# Patient Record
Sex: Female | Born: 1945 | Race: Black or African American | Hispanic: No
Health system: Southern US, Community
[De-identification: ages and names within clinical notes are randomized; demographics above are authoritative.]

---

## 2007-04-08 ENCOUNTER — Inpatient Hospital Stay: Payer: Self-pay | Admitting: Internal Medicine

## 2007-04-08 ENCOUNTER — Other Ambulatory Visit: Payer: Self-pay

## 2007-10-27 ENCOUNTER — Other Ambulatory Visit: Payer: Self-pay

## 2007-10-27 ENCOUNTER — Emergency Department: Payer: Self-pay | Admitting: Emergency Medicine

## 2007-11-02 ENCOUNTER — Ambulatory Visit: Payer: Self-pay | Admitting: Family Medicine

## 2012-05-26 ENCOUNTER — Ambulatory Visit: Payer: Self-pay | Admitting: Family Medicine

## 2013-06-08 ENCOUNTER — Ambulatory Visit: Payer: Self-pay | Admitting: Family Medicine

## 2013-07-19 ENCOUNTER — Ambulatory Visit: Payer: Self-pay | Admitting: Specialist

## 2013-08-01 ENCOUNTER — Ambulatory Visit: Payer: Self-pay | Admitting: Specialist

## 2014-12-30 ENCOUNTER — Emergency Department: Payer: Self-pay | Admitting: Student

## 2015-03-07 ENCOUNTER — Other Ambulatory Visit: Payer: Self-pay | Admitting: Neurology

## 2015-03-11 NOTE — Consult Note (Signed)
PATIENT NAME:  Suzanne Thompson, Suzanne Thompson DATE OF BIRTH:  [birth year]  DATE OF CONSULTATION:  12/31/2014  CONSULTING PHYSICIAN:  Sharlet Notaro K. Sahana Boyland, MD  SUBJECTIVE: The patient was seen in the [facility] Emergency Room, 21A, with her daughter who is a good historian. The patient is a 69 years old and is retired. Has been on disability for mental illness, that is schizoaffective disorder, bipolar, depressed for many years. The patient is divorced and lives by herself in a 1 bedroom apartment. The patient is being followed by Dr. at [facility]. The last appointment was just a few weeks ago. The next appointment is coming up on Wednesday, 01/03/2015. The patient is on IM medication and she is stabilized. The patient was brought by her daughter because she has bizarre and erratic behavior. She was speaking to individuals that were not there and this was a big concern and they thought that she was depressed. In addition, they thought that she was probably hallucinating. She has had worsening depression for the past 1 month or so and concerned about responding to internal stimuli. Is muttering and speaking at invisible objects. The daughter called [facility] and they recommended that she be brought here for help to be checked out.   PAST PSYCHIATRIC HISTORY: History of inpatient hospital psychiatry on several occasions many years ago and then finally she was stabilized. Recently, she has not had any inpatient hospitalizations and is being followed on an outpatient basis at [facility]. Is doing fine on IM shot which she gets once a month.   MENTAL STATUS: The patient was seen lying in bed. She is alert and she knew her name. She did not know the day, date or time and the daughter reports that she never keeps up with the same. Affect is flat. Mood appears to be low and down. Does not appear to be actively responding to internal stimuli. Calm, pleasant and cooperative. No agitation. Denies  any active suicidal or homicidal plans. Insight and judgment guarded. Impulse control is fair.   IMPRESSION: Schizoaffective disorder, bipolar, current episode depressed.   RECOMMEND: Continue current medication. The patient is to be re-evaluated in the morning of 01/01/2015 to see how she is responding and doing. The patient has an appointment coming up with her outpatient doctor at [facility] on 01/03/2015. She should be considered for ACT Team so that she can get close follow up and evaluation and help as needed and social services is to look into that with the help of the physician.        ____________________________ Jannet MantisSurya K. Guss Bundehalla, MD skc:TT D: 12/31/2014 14:22:03 ET T: 12/31/2014 15:03:55 ET JOB#: [id number]  cc: Monika SalkSurya K. Guss Bundehalla, MD, <Dictator> Beau FannySURYA K Tawan Degroote MD ELECTRONICALLY SIGNED 01/06/2015 16:22

## 2015-03-11 NOTE — Consult Note (Signed)
PATIENT NAME:  Suzanne Thompson, Suzanne Thompson MR#:  [id number] DATE OF BIRTH:  Aug 24, 1946  DATE OF CONSULTATION:  01/01/2015  CONSULTING PHYSICIAN:  Audery Amel, MD  HISTORY OF PRESENT ILLNESS: Follow-up note for this 69 year old woman who was brought to the Emergency Room by her family because of their concern that she has been getting more depressed and psychotic.    CHIEF COMPLAINT: "My family was concerned."   HISTORY OF PRESENT ILLNESS: Information from the chart and the patient. The patient was apparently not talking or talking very little when she first came to the Emergency Room, but then opened up and spoke a little bit to Dr. Guss Bunde. Today, she conversed with me although again she did not give a lot of detail. She said that she had stopped taking her medicine some time ago. Her reason for stopping it is that she thought that maybe it was having side effects although she cannot tell me exactly which medicine she even stopped. She says that she had been having some thoughts recently that were problems. She will not give me any details about what those are. She denies suicidal or homicidal ideation. She denies any hallucinations, although she seems to be a little evasive on that point. No evidence of any substance abuse.   I spoke with her daughter, Ava, today. Daughter is also coming to visit the patient this afternoon. Daughter repeats to me that the patient had seemed to be going downhill for at least several weeks getting more withdrawn, talking bizarre stuff, having delusions and the family was getting more concerned about her.   PAST HISTORY: History of schizoaffective disorder and had multiple hospitalizations in the past. The patient recalls those hospitalizations as also involving bad thoughts. She denies ever having tried to kill herself or ever being violent. Substance abuse history: No evidence here of any history of substance abuse.   PAST MEDICAL HISTORY: Overweight woman, honestly, looks  probably at least her stated age or older. She is on medicine for blood pressure, hypothyroidism, and diabetes.   REVIEW OF SYSTEMS: The patient tends to deny anything specific right now. Denies suicidal ideation. Denies hallucinations. Does not complain of any specific physical symptoms.   MENTAL STATUS EXAMINATION: Disheveled woman. Passively cooperative. Eye contact intermittent. Psychomotor activity slow and sluggish. Speech is minimal in amount and quiet with some whispering almost at times. Affect has a somewhat odd smile about it. Not very reactive. Mood is stated as being better. Thoughts are showing some thought blocking and simplicity of thinking. Denies hallucinations. Denies suicidal or homicidal ideation. The patient was not given further formal cognitive testing. She is oriented to being in the hospital and her current situation. I see from Dr. Ranelle Oyster note that the patient says she never keeps up with the date.   LABORATORY RESULTS: Urinalysis shows more red cells than white cells, no bacteria, probably not infected. Head CT shows no diagnostic findings.   ASSESSMENT: A 69 year old woman with schizoaffective disorder. She has been off her medicine probably for over a month. Exactly what she had stopped taking and what she was still taking is unclear. She was brought into the hospital almost unresponsive with psychosis. Today she is more responsive but still not thinking clearly, very slow, flat, and sluggish, with minimal elaboration to her speech. Given that she lives independently it seems to me that she probably needs more time to stabilize. So far she has been given her oral medicine including her thyroid medication and her antidepressant. I  do not think we have given her an antipsychotic injection yet.   TREATMENT PLAN: The case was discussed with the Emergency Room psychiatry staff. I recommend that we refer her to a geriatric psychiatry unit for admission and stabilization. I plan to  speak to the daughter again after she visits with the patient. If daughter feels strongly that the patient is safe to come back home we can reconsider the plan but at this point, I think hospitalization would probably be wisest. Continue current medicines for now.   DIAGNOSIS, PRINCIPAL AND PRIMARY: AXIS I: Schizoaffective disorder, depressed.   SECONDARY DIAGNOSES:  1.  AXIS I: No further.  2.  AXIS II: No diagnosis.  3.  AXIS III: Diabetes, high blood pressure, hypothyroidism, and overweight.    ___________________________ Audery AmelJohn T. Clapacs, MD jtc:mc D: 01/01/2015 13:06:31 ET T: 01/01/2015 13:23:13 ET JOB#: [id number]  cc: Audery AmelJohn T. Clapacs, MD, <Dictator> Audery AmelJOHN T CLAPACS MD ELECTRONICALLY SIGNED 01/16/2015 10:35

## 2015-03-11 NOTE — Consult Note (Signed)
P patient will be transferred to a geriatric psychiatry facility as soon as one is available.  I think one may be opening up today in which case the transfer will be done asremains confused.  Patient makes good eye contact and smiles today but responds very little.  Seems to be still confused and not thinking clearly.  She is eating.  Taking medicine.  No new physical complaints. disorganized affect flat.  patient will be transferred to a geriatric psychiatry facility as soon as one is available.  I think one may be opening up today in which case the transfer will be done as soon as possible for further care.  Patient is aware of a plan.  No further change at this time.  Electronic Signatures: Selia Wareing, Jackquline DenmarkJohn T (MD)  (Signed on 23-Feb-16 18:55)  Authored  Last Updated: 23-Feb-16 18:55 by Audery Amellapacs, Mareo Portilla T (MD)

## 2016-01-09 DEATH — deceased

## 2016-10-14 IMAGING — CT CT HEAD WITHOUT CONTRAST
2 of 3 series · 16 of 30 positions shown, 18 images · non-contrast
Comparison: None.

CLINICAL DATA: Per daughter, pt normally lives alone and cares for
self, she called mom and there was no answer, went to check on her
and pt withdrawn and less verbal, some paranoid conversation, pt in
triage is hesitant to answer and when she does answer is clear but
rambling ideas, denies pain. Pt now unable to answer any med hx
questions. Pt with documented history of bipolar disorder and
schizophrenia.

EXAM:
CT HEAD WITHOUT CONTRAST
TECHNIQUE: Contiguous axial images were obtained from the base of the skull
through the vertex without intravenous contrast.

[Series 2: soft tissue · axial · 0.44mm/px · z∈[-268,-148]mm · 8 of 32 slices shown, 10 images]
[im 4/32  brain]
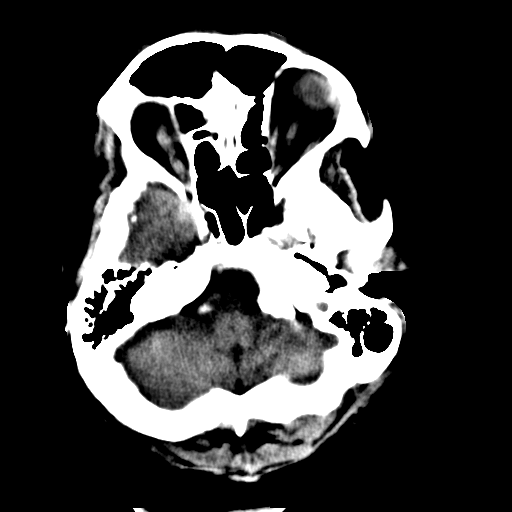
[im 4/32  bone]
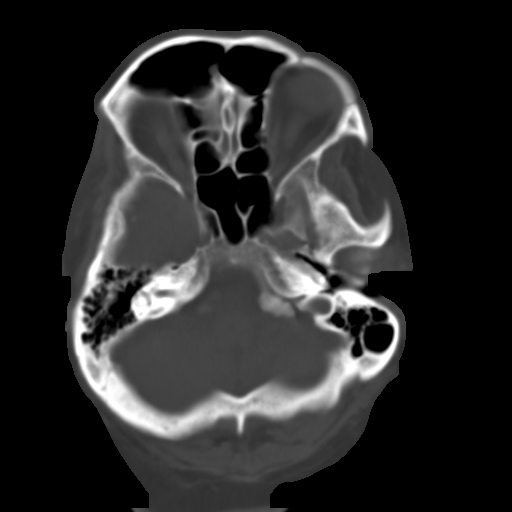
[im 7/32  brain]
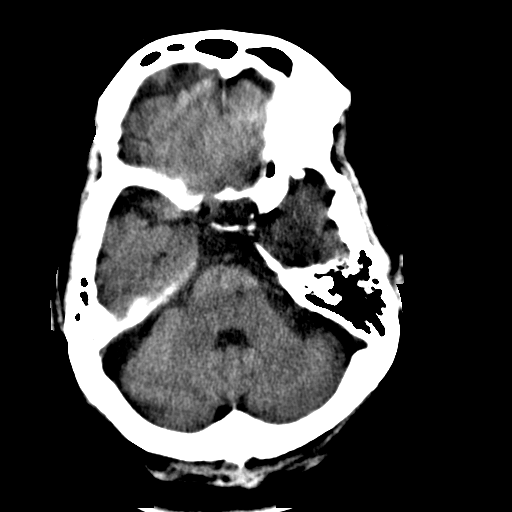
[im 11/32  brain]
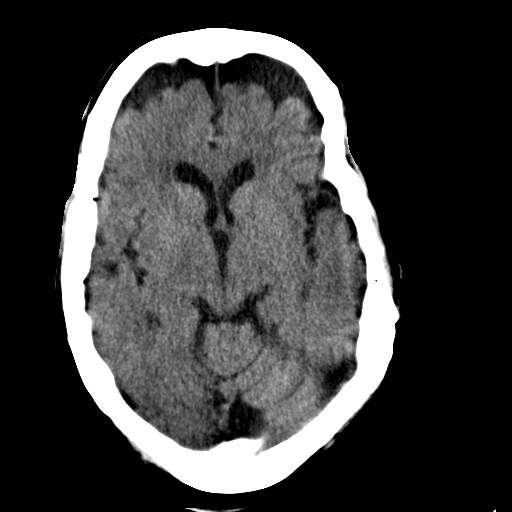
[im 14/32  brain]
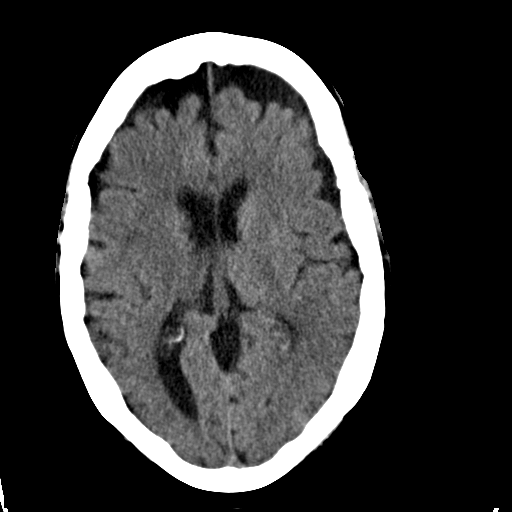
[im 18/32  brain]
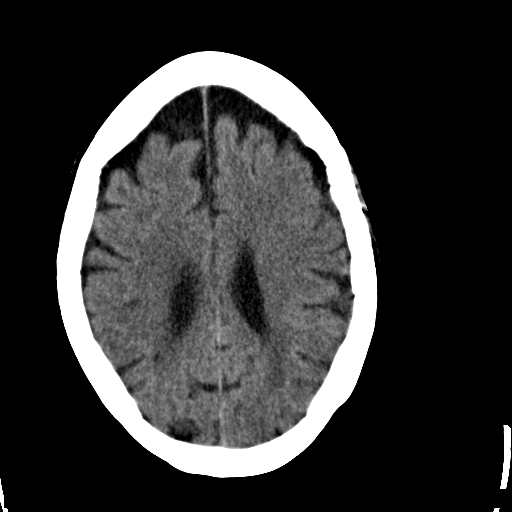
[im 18/32  bone]
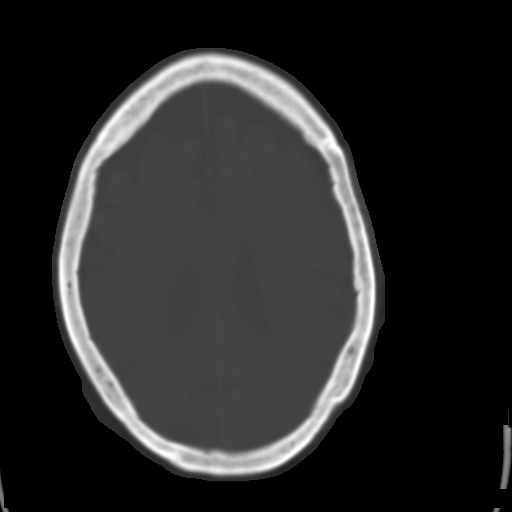
[im 21/32  brain]
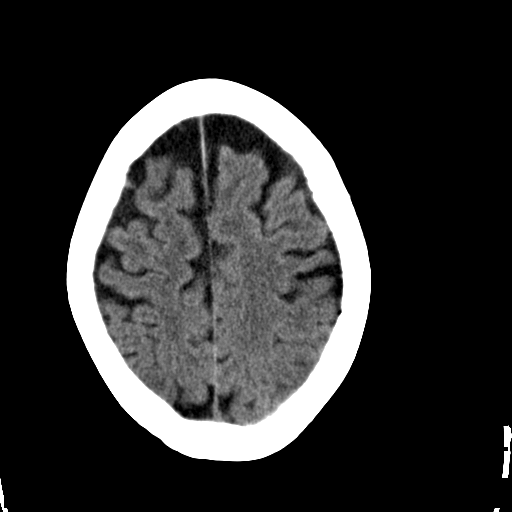
[im 25/32  brain]
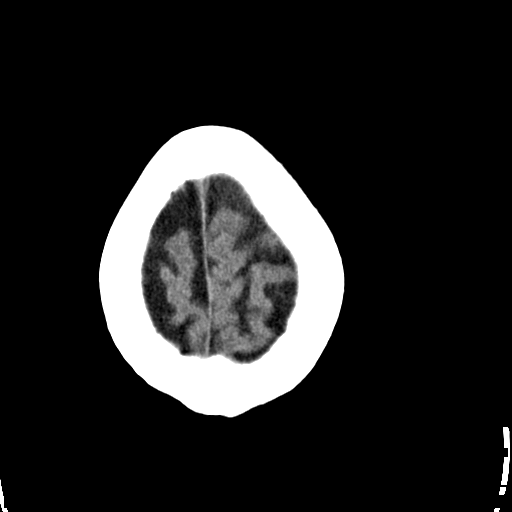
[im 28/32  brain]
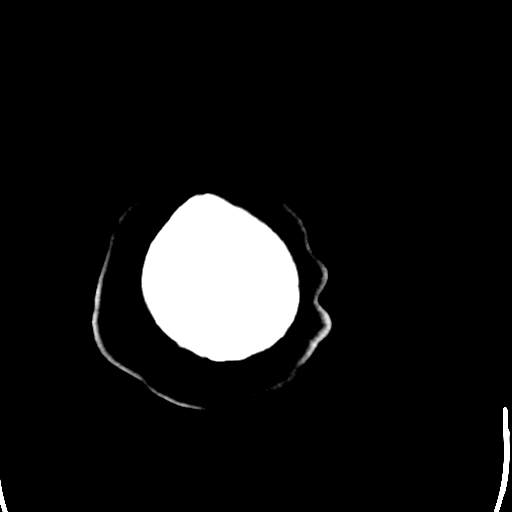

[Series 4: soft tissue recon · axial · 0.42mm/px · z∈[-287,-168]mm · 8 of 32 slices shown]
[im 4/32  brain]
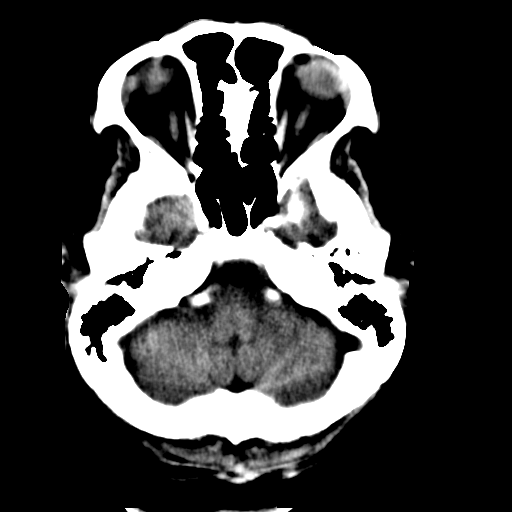
[im 7/32  brain]
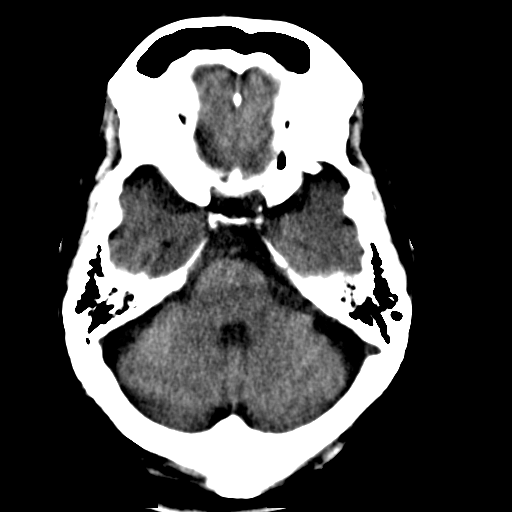
[im 11/32  brain]
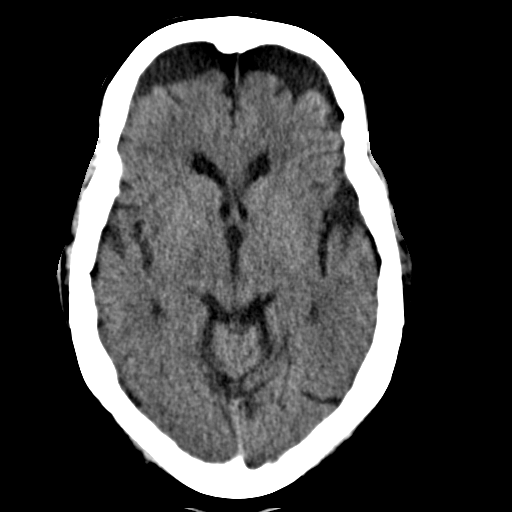
[im 14/32  brain]
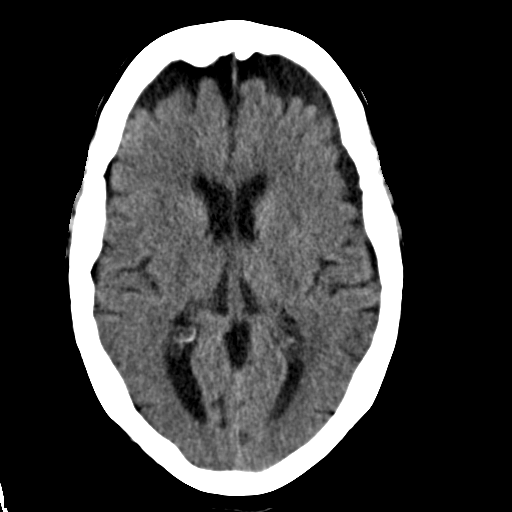
[im 18/32  brain]
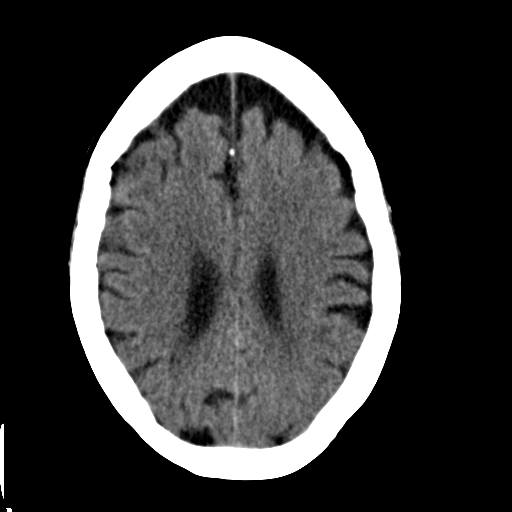
[im 21/32  brain]
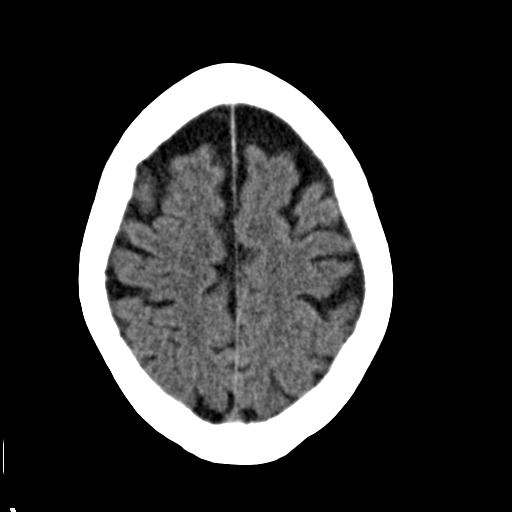
[im 25/32  brain]
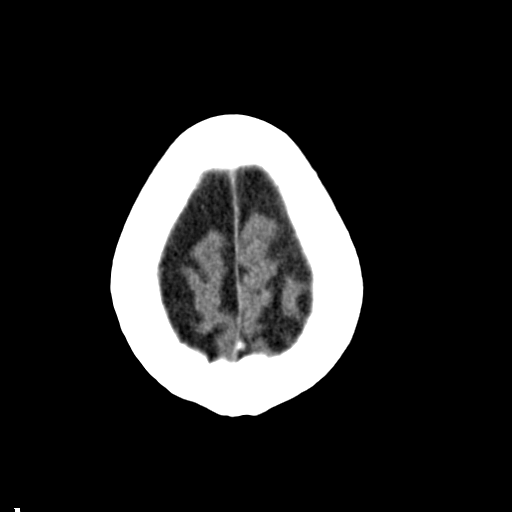
[im 28/32  brain]
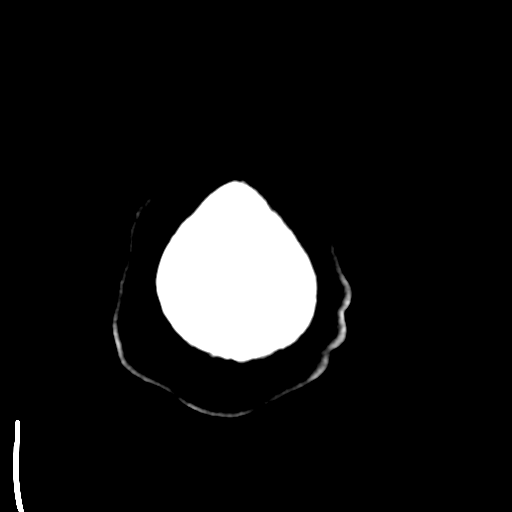

[16 of 30 positions shown; findings below may reference images not displayed]

FINDINGS: Ventricles are normal in configuration. There is mild ventricular
enlargement. There is more significant sulcal widening consistent
with moderate atrophy. No hydrocephalus.

There are no parenchymal masses or mass effect. There is no evidence
of a cortical infarct. There are few small areas of patchy white
matter hypoattenuation consistent with chronic microvascular
ischemic change.

There are no extra-axial masses. Widened extra-axial spaces are
noted mostly over the frontal lobes.

There is no intracranial hemorrhage. Visualized sinuses and mastoid
air cells are clear.
IMPRESSION: 1. No acute intracranial abnormalities.
2. Atrophy and mild chronic microvascular ischemic change.

## 2016-10-16 IMAGING — CR DG CHEST 2V
1 series · 2 of 2 positions shown · non-contrast
Comparison: Chest x-ray 05/26/2012.

CLINICAL DATA: 69-year-old female under evaluation for possible
placement in the facility. Unable to give history.

EXAM:
CHEST  2 VIEW

[Series 1: dxr chest pa (or ap) and lateral · 0.14mm/px · 2 of 2 slices shown]
[im 1/2]
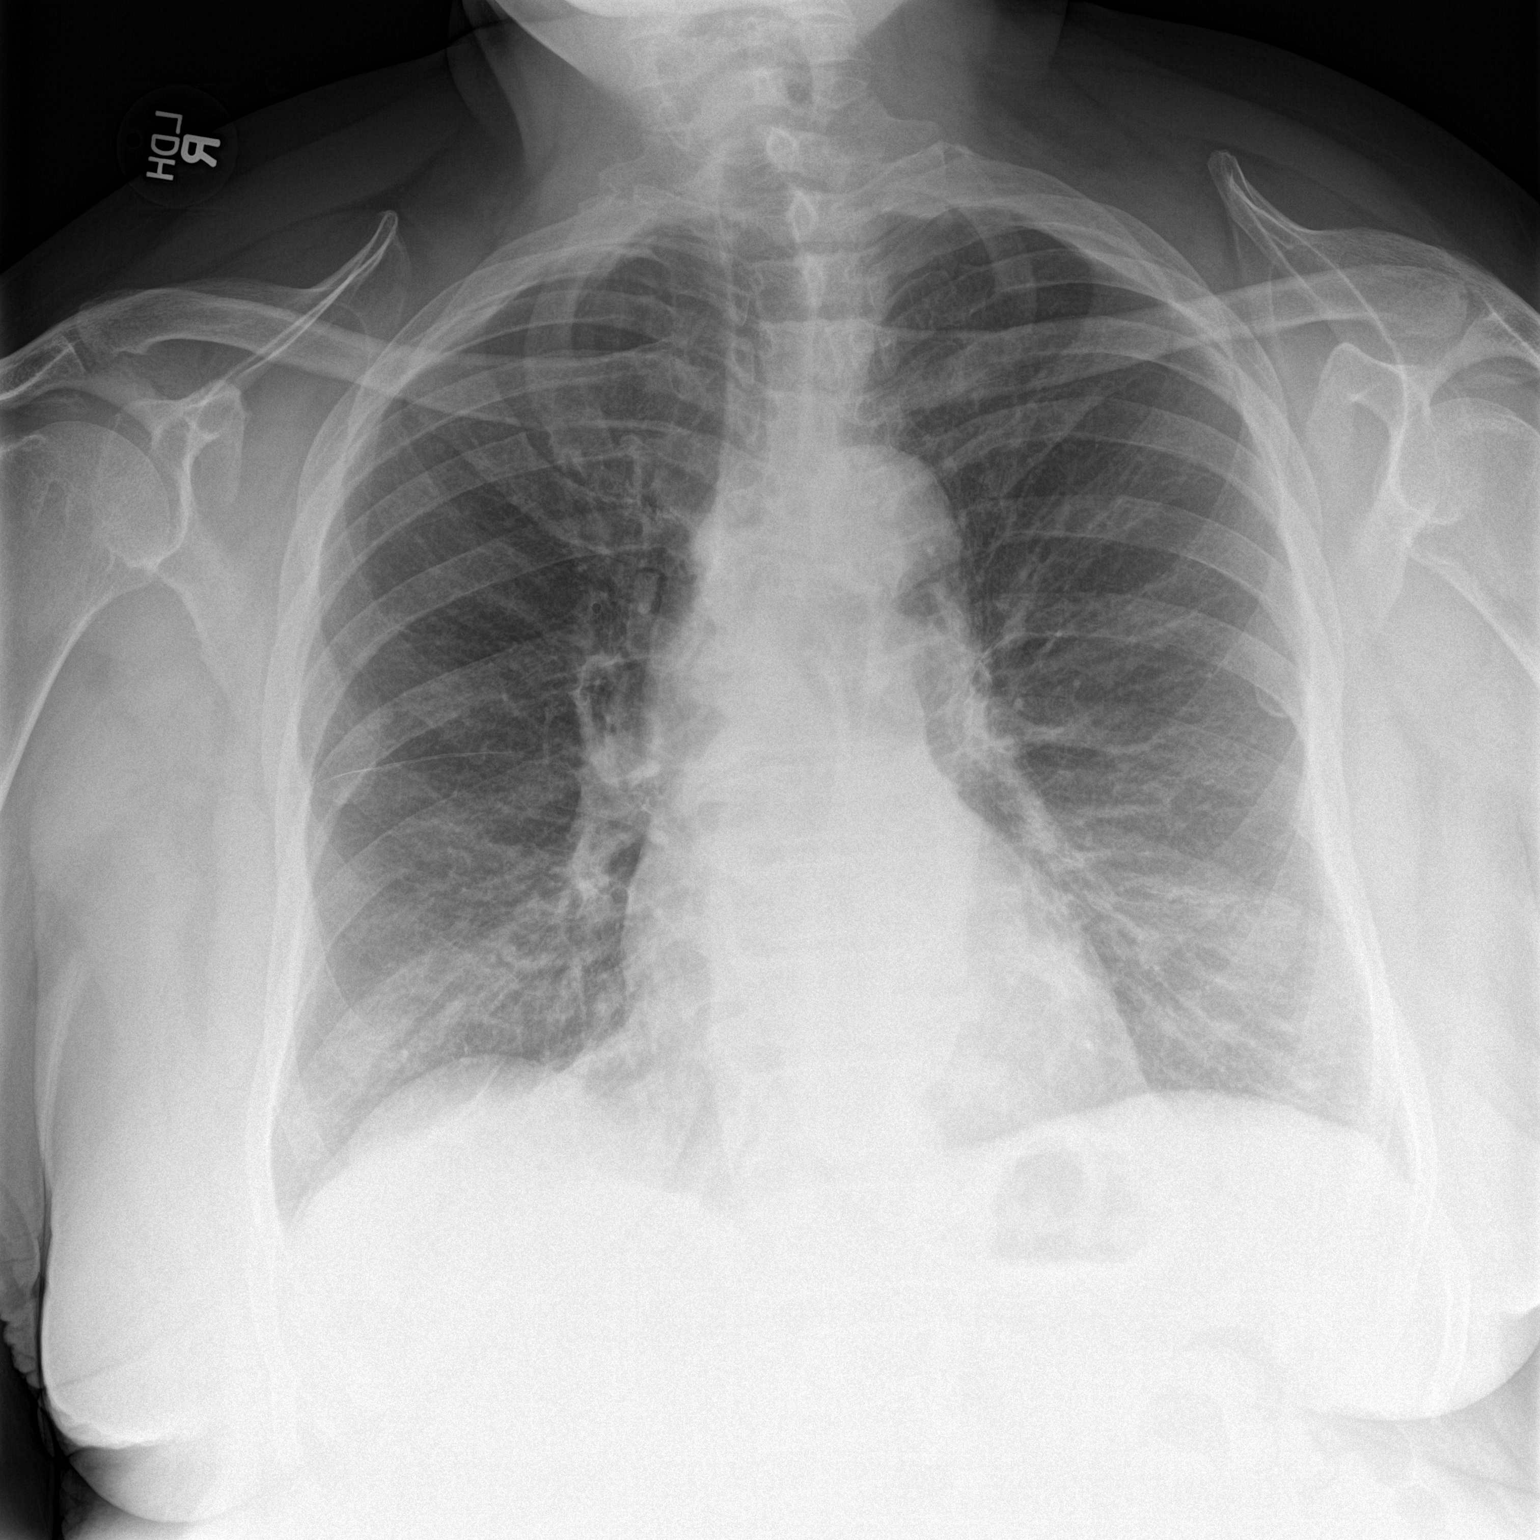
[im 2/2]
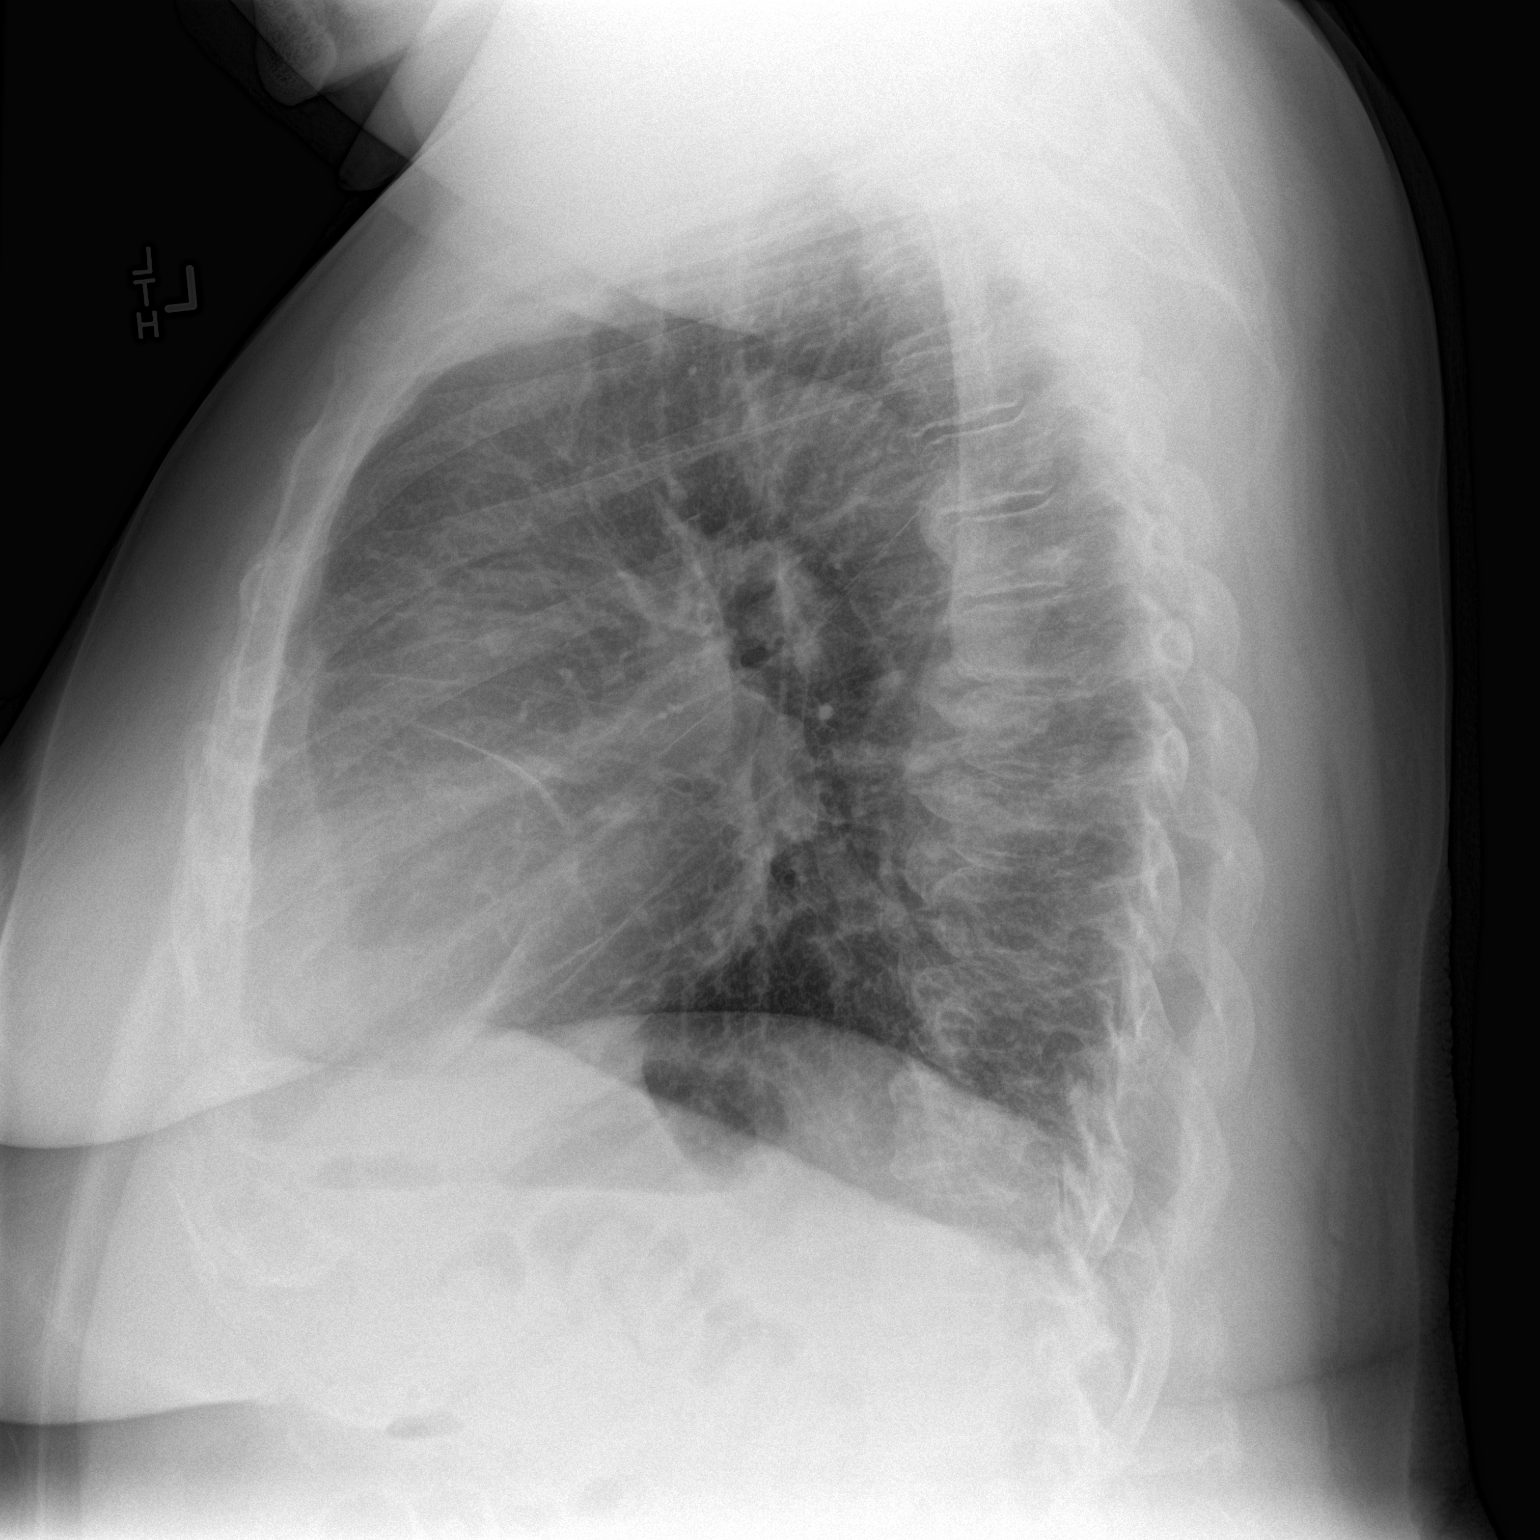

[2 of 2 positions shown; findings below may reference images not displayed]

FINDINGS: Mild diffuse peribronchial cuffing. Lung volumes are normal. No
consolidative airspace disease. Mild scarring in the right middle
lobe. No pleural effusions. No pneumothorax. No pulmonary nodule or
mass noted. Pulmonary vasculature and the cardiomediastinal
silhouette are within normal limits.
IMPRESSION: 1. Mild diffuse peribronchial cuffing may suggest acute bronchitis.
